# Patient Record
Sex: Female | Born: 2000 | Race: White | Hispanic: No | Marital: Single | Smoking: Current some day smoker
Health system: Southern US, Community
[De-identification: ages and names within clinical notes are randomized; demographics above are authoritative.]

## PROBLEM LIST (undated history)

## (undated) DIAGNOSIS — E063 Autoimmune thyroiditis: Secondary | ICD-10-CM

## (undated) DIAGNOSIS — R109 Unspecified abdominal pain: Secondary | ICD-10-CM

## (undated) DIAGNOSIS — F32A Depression, unspecified: Secondary | ICD-10-CM

## (undated) DIAGNOSIS — F419 Anxiety disorder, unspecified: Secondary | ICD-10-CM

## (undated) DIAGNOSIS — K589 Irritable bowel syndrome without diarrhea: Secondary | ICD-10-CM

## (undated) DIAGNOSIS — F329 Major depressive disorder, single episode, unspecified: Secondary | ICD-10-CM

## (undated) DIAGNOSIS — R197 Diarrhea, unspecified: Secondary | ICD-10-CM

## (undated) DIAGNOSIS — K900 Celiac disease: Secondary | ICD-10-CM

## (undated) DIAGNOSIS — F332 Major depressive disorder, recurrent severe without psychotic features: Secondary | ICD-10-CM

## (undated) HISTORY — PX: TONSILLECTOMY: SUR1361

## (undated) HISTORY — DX: Autoimmune thyroiditis: E06.3

## (undated) HISTORY — PX: ADENOIDECTOMY: SHX5191

## (undated) HISTORY — DX: Unspecified abdominal pain: R10.9

## (undated) HISTORY — DX: Diarrhea, unspecified: R19.7

## (undated) HISTORY — PX: TYMPANOSTOMY TUBE PLACEMENT: SHX32

---
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
## 2022-11-01 DIAGNOSIS — H6123 Impacted cerumen, bilateral: Secondary | ICD-10-CM | POA: Diagnosis not present

## 2024-01-30 ENCOUNTER — Ambulatory Visit: Payer: Self-pay | Admitting: General Practice

## 2024-07-14 ENCOUNTER — Ambulatory Visit: Payer: Self-pay | Admitting: Family Medicine

## 2024-07-14 ENCOUNTER — Encounter: Payer: Self-pay | Admitting: Family Medicine

## 2024-07-14 VITALS — BP 114/82 | HR 64 | Temp 98.2°F | Ht 66.0 in | Wt 294.5 lb

## 2024-07-14 DIAGNOSIS — N921 Excessive and frequent menstruation with irregular cycle: Secondary | ICD-10-CM | POA: Diagnosis not present

## 2024-07-14 DIAGNOSIS — K900 Celiac disease: Secondary | ICD-10-CM | POA: Diagnosis not present

## 2024-07-14 DIAGNOSIS — K2100 Gastro-esophageal reflux disease with esophagitis, without bleeding: Secondary | ICD-10-CM | POA: Diagnosis not present

## 2024-07-14 DIAGNOSIS — N920 Excessive and frequent menstruation with regular cycle: Secondary | ICD-10-CM | POA: Insufficient documentation

## 2024-07-14 DIAGNOSIS — F17200 Nicotine dependence, unspecified, uncomplicated: Secondary | ICD-10-CM

## 2024-07-14 DIAGNOSIS — E063 Autoimmune thyroiditis: Secondary | ICD-10-CM | POA: Diagnosis not present

## 2024-07-14 DIAGNOSIS — E039 Hypothyroidism, unspecified: Secondary | ICD-10-CM | POA: Insufficient documentation

## 2024-07-14 DIAGNOSIS — F3342 Major depressive disorder, recurrent, in full remission: Secondary | ICD-10-CM

## 2024-07-14 LAB — CBC WITH DIFFERENTIAL/PLATELET
Basophils Absolute: 0 K/uL (ref 0.0–0.1)
Basophils Relative: 0.6 % (ref 0.0–3.0)
Eosinophils Absolute: 0.2 K/uL (ref 0.0–0.7)
Eosinophils Relative: 3.8 % (ref 0.0–5.0)
HCT: 39.4 % (ref 36.0–46.0)
Hemoglobin: 13.2 g/dL (ref 12.0–15.0)
Lymphocytes Relative: 28.7 % (ref 12.0–46.0)
Lymphs Abs: 1.5 K/uL (ref 0.7–4.0)
MCHC: 33.6 g/dL (ref 30.0–36.0)
MCV: 86.9 fl (ref 78.0–100.0)
Monocytes Absolute: 0.4 K/uL (ref 0.1–1.0)
Monocytes Relative: 7.3 % (ref 3.0–12.0)
Neutro Abs: 3.2 K/uL (ref 1.4–7.7)
Neutrophils Relative %: 59.6 % (ref 43.0–77.0)
Platelets: 323 K/uL (ref 150.0–400.0)
RBC: 4.53 Mil/uL (ref 3.87–5.11)
RDW: 14.5 % (ref 11.5–15.5)
WBC: 5.4 K/uL (ref 4.0–10.5)

## 2024-07-14 LAB — COMPREHENSIVE METABOLIC PANEL WITH GFR
ALT: 20 U/L (ref 0–35)
AST: 12 U/L (ref 0–37)
Albumin: 4.3 g/dL (ref 3.5–5.2)
Alkaline Phosphatase: 58 U/L (ref 39–117)
BUN: 14 mg/dL (ref 6–23)
CO2: 26 meq/L (ref 19–32)
Calcium: 9.1 mg/dL (ref 8.4–10.5)
Chloride: 108 meq/L (ref 96–112)
Creatinine, Ser: 0.56 mg/dL (ref 0.40–1.20)
GFR: 130.84 mL/min (ref >60.00)
Glucose, Bld: 96 mg/dL (ref 70–99)
Potassium: 4.2 meq/L (ref 3.5–5.1)
Sodium: 140 meq/L (ref 135–145)
Total Bilirubin: 0.4 mg/dL (ref 0.2–1.2)
Total Protein: 7 g/dL (ref 6.0–8.3)

## 2024-07-14 LAB — LIPID PANEL
Cholesterol: 159 mg/dL (ref 0–200)
HDL: 43.4 mg/dL (ref >39.00)
LDL Cholesterol: 101 mg/dL — ABNORMAL HIGH (ref 0–99)
NonHDL: 115.75
Total CHOL/HDL Ratio: 4
Triglycerides: 76 mg/dL (ref 0.0–149.0)
VLDL: 15.2 mg/dL (ref 0.0–40.0)

## 2024-07-14 LAB — T4, FREE: Free T4: 0.73 ng/dL (ref 0.60–1.60)

## 2024-07-14 LAB — TSH: TSH: 1.6 u[IU]/mL (ref 0.35–5.50)

## 2024-07-14 NOTE — Assessment & Plan Note (Signed)
 Saw peds endo in past  Last took levothyroxine years ago- 75 mcg daily  Does not feel different off of it  Per pt no goiter or neck symptoms  Reassuring exam   Lab today

## 2024-07-14 NOTE — Patient Instructions (Signed)
 Labs today   Take care of yourself    Let us  know where you want to go to obgyn

## 2024-07-14 NOTE — Assessment & Plan Note (Signed)
 From Hashimoto's thyroiditis   No care in years  Past levothyroxine  Lab today including cholesterol

## 2024-07-14 NOTE — Assessment & Plan Note (Signed)
 Does not have gyn provider and desires referral Heavy menses-pad and super tampons- changes within the hour  Some irregular menses   Pt will ask where her sister goes and call us  with clinic she is interested in   Lab today

## 2024-07-14 NOTE — Progress Notes (Signed)
 Subjective:    Patient ID: Barbara Farrell, female    DOB: [birth year], 23 y.o.   MRN: [id number]  HPI  Wt Readings from Last 3 Encounters:  07/14/24 294 lb 8 oz (133.6 kg)  05/14/17 228 lb 2.8 oz (103.5 kg) (>99%, Z= 2.71)*  01/01/17 230 lb 6.1 oz (104.5 kg) (>99%, Z= 2.82)*   * Growth percentiles are based on CDC (Girls, 2-20 Years) data.   47.53 kg/m  Vitals:   07/14/24 0941  BP: 114/82  Pulse: 64  Temp: 98.2 F (36.8 C)  SpO2: 98%    Pt presents to get established for primary care   Pmhx notable for  Hashimoto's thyroiditis Celiac dz  GERD Depression   Declines flu shot    Thyroid   Used to take levothyroxine  Last dose 75 mcg daily - does not remember how she felt with this (in the midst of celiac)  Not feeling any different   Mood  Used to take fluoxetine  40 mg daily  Stopped getting care when her mother had strokes/ lost to care then Does not think she needs medicine   Did have hospitalization for mood - 19 or 23 yo  Some self harm there  Does not feel that way anymore   Still has bad anxiety but doing well overall      07/14/2024   10:43 AM  Depression screen PHQ 2/9  Decreased Interest 0  Down, Depressed, Hopeless 0  PHQ - 2 Score 0  Altered sleeping 0  Tired, decreased energy 0  Change in appetite 1  Feeling bad or failure about yourself  0  Trouble concentrating 0  Moving slowly or fidgety/restless 0  Suicidal thoughts 0  PHQ-9 Score 1  Difficult doing work/chores Not difficult at all       07/14/2024   10:44 AM  GAD 7 : Generalized Anxiety Score  Nervous, Anxious, on Edge 1  Control/stop worrying 0  Worry too much - different things 1  Trouble relaxing 0  Restless 0  Easily annoyed or irritable 1  Afraid - awful might happen 0  Total GAD 7 Score 3  Anxiety Difficulty Not difficult at all    Used to go to [facility]    GI  GERD- took protonix 40 mg daily in past Also carafate   She stopped drinking soda and avoids  triggers   Celiac- severe  Gets constipation and feels sick if she eats wheat  Vomiting  Is on gluten free diet  Doing a lot better  Symptom free if on the diet    Colonoscopy 2019 -none since   HIDA and us  2019   Needs to get set up with obgyn No contraception or need for it  One partner  Declines need for std screening  Not trying to get pregnant  Very heavy periods -does not take anything  Sometimes irregular -or more than one period per month   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 [employer]         Patient Active Problem List   Diagnosis Date Noted   Heavy menses 07/14/2024   Hypothyroid 07/14/2024   Vaping nicotine dependence, 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   Hashimoto's thyroiditis 10/23/2017   MDD (major depressive disorder) 05/14/2017   Gastroesophageal reflux disease 12/13/2015   Celiac disease 12/13/2015   Past Medical History:  Diagnosis Date   Abdominal pain, recurrent    Anxiety    Celiac disease    Depression    Diarrhea  Hashimoto's disease    IBS (irritable bowel syndrome)    MDD (major depressive disorder), recurrent severe, 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   Past Surgical History:  Procedure Laterality Date   ADENOIDECTOMY     TONSILLECTOMY     TYMPANOSTOMY TUBE PLACEMENT     Social History   Tobacco Use   Smoking status: Some Days    Types: Cigarettes   Smokeless tobacco: Never  Vaping Use   Vaping status: Every Day  Substance Use Topics   Alcohol use: Yes    Comment: Socially   Drug use: Yes    Types: Marijuana    Comment: Vape pen with possible Marijuana   Family History  Problem Relation Age of Onset   Stroke Mother    Hashimoto's thyroiditis Mother    Graves' disease Mother    Heart disease Mother    Atrial fibrillation Father    Stroke Maternal Grandmother    Miscarriages / Stillbirths Maternal Grandmother    Hypertension Maternal Grandmother    Hyperlipidemia Maternal Grandmother    Heart disease  Maternal Grandmother    Diabetes Maternal Grandmother    Cancer Maternal Grandmother    Kidney disease Maternal Grandfather    Miscarriages / Stillbirths Paternal Grandmother    COPD Paternal Grandmother    Arthritis Paternal Grandfather    Allergies  Allergen Reactions   Gluten Meal     No wheat, rye, barley, brewers yeast, malt.  Strict adherance is imperative per parents.  No cross contamination.   Other     Celiac Disease    No current outpatient medications on file prior to visit.   No current facility-administered medications on file prior to visit.    Review of Systems  Constitutional:  Negative for activity change, appetite change, fatigue, fever and unexpected weight change.  HENT:  Negative for congestion, ear pain, rhinorrhea, sinus pressure and sore throat.   Eyes:  Negative for pain, redness and visual disturbance.  Respiratory:  Negative for cough, shortness of breath and wheezing.   Cardiovascular:  Negative for chest pain and palpitations.  Gastrointestinal:  Negative for abdominal pain, blood in stool, constipation and diarrhea.  Endocrine: Negative for polydipsia and polyuria.  Genitourinary:  Positive for menstrual problem. Negative for dysuria, frequency and urgency.  Musculoskeletal:  Negative for arthralgias, back pain and myalgias.  Skin:  Negative for pallor and rash.  Allergic/Immunologic: Negative for environmental allergies.  Neurological:  Negative for dizziness, syncope and headaches.  Hematological:  Negative for adenopathy. Does not bruise/bleed easily.  Psychiatric/Behavioral:  Negative for decreased concentration and dysphoric mood. The patient is nervous/anxious.        Objective:   Physical Exam Constitutional:      General: She is not in acute distress.    Appearance: Normal appearance. She is well-developed. She is obese. She is not ill-appearing or diaphoretic.  HENT:     Head: Normocephalic and atraumatic.  Eyes:      Conjunctiva/sclera: Conjunctivae normal.     Pupils: Pupils are equal, round, and reactive to light.  Neck:     Thyroid : No thyromegaly.     Vascular: No carotid bruit or JVD.     Comments: Thyroid  is symmetric by palpation  Non tender  No thyroid  bruits  Cardiovascular:     Rate and Rhythm: Normal rate and regular rhythm.     Heart sounds: Normal heart sounds.     No gallop.  Pulmonary:     Effort: Pulmonary effort is normal. No  respiratory distress.     Breath sounds: Normal breath sounds. No wheezing or rales.  Abdominal:     General: There is no distension or abdominal bruit.     Palpations: Abdomen is soft. There is no mass.     Tenderness: There is no abdominal tenderness. There is no guarding or rebound.  Musculoskeletal:     Cervical back: Normal range of motion and neck supple.     Right lower leg: No edema.     Left lower leg: No edema.  Lymphadenopathy:     Cervical: No cervical adenopathy.  Skin:    General: Skin is warm and dry.     Coloration: Skin is not pale.     Findings: No rash.     Comments: Solar lentigines diffusely   Neurological:     Mental Status: She is alert.     Coordination: Coordination normal.     Deep Tendon Reflexes: Reflexes are normal and symmetric. Reflexes normal.     Comments: No tremor   Psychiatric:        Mood and Affect: Mood normal.           Assessment & Plan:   Problem List Items Addressed This Visit       Digestive   Gastroesophageal reflux disease   GERD with duodenitis (in setting of celiac) in past Took protonix and carafate  in past Nothing now  If she avoids triggers and gluten-no complaints Reassuring exam      Celiac disease   Last colonoscopy 2019 Gluten free diet  No problems as long as she sticks to this      Relevant Orders   CBC with Differential/Platelet     Endocrine   Hypothyroid   From Hashimoto's thyroiditis   No care in years  Past levothyroxine  Lab today including cholesterol       Relevant Orders   TSH   Lipid panel   T4, free   Hashimoto's thyroiditis - Primary   Saw peds endo in past  Last took levothyroxine years ago- 75 mcg daily  Does not feel different off of it  Per pt no goiter or neck symptoms  Reassuring exam   Lab today      Relevant Orders   TSH   T4, free     Other   Vaping nicotine dependence, non-tobacco product   Vapes nicotine once in a while  Plans on quitting soon with her partner Does not think she will need help   Reassuring exam Discussed reasons to uqit       MDD (major depressive disorder)   Had severe depression at age [age] with self harm  Was hospitalized once  Previously under care of psychiatry    Doing well now overall Some anxiety but does not desire treatment        Heavy menses   Does not have gyn provider and desires referral Heavy menses-pad and super tampons- changes within the hour  Some irregular menses   Pt will ask where her sister goes and call us  with clinic she is interested in   Lab today      Relevant Orders   Comprehensive metabolic panel with GFR   CBC with Differential/Platelet

## 2024-07-14 NOTE — Assessment & Plan Note (Signed)
 Had severe depression at age 21-16 with self harm  Was hospitalized once  Previously under care of psychiatry    Doing well now overall Some anxiety but does not desire treatment

## 2024-07-14 NOTE — Assessment & Plan Note (Signed)
 GERD with duodenitis (in setting of celiac) in past Took protonix and carafate  in past Nothing now  If she avoids triggers and gluten-no complaints Reassuring exam

## 2024-07-14 NOTE — Assessment & Plan Note (Signed)
 Last colonoscopy 2019 Gluten free diet  No problems as long as she sticks to this

## 2024-07-14 NOTE — Assessment & Plan Note (Signed)
 Vapes nicotine once in a while  Plans on quitting soon with her partner Does not think she will need help   Reassuring exam Discussed reasons to uqit

## 2024-09-22 ENCOUNTER — Encounter

## 2024-09-24 ENCOUNTER — Ambulatory Visit

## 2024-09-24 ENCOUNTER — Other Ambulatory Visit: Payer: Self-pay

## 2024-09-24 VITALS — BP 134/82 | HR 79 | Wt 304.5 lb

## 2024-09-24 DIAGNOSIS — Z3480 Encounter for supervision of other normal pregnancy, unspecified trimester: Secondary | ICD-10-CM | POA: Insufficient documentation

## 2024-09-24 DIAGNOSIS — O3680X0 Pregnancy with inconclusive fetal viability, not applicable or unspecified: Secondary | ICD-10-CM

## 2024-09-24 MED ORDER — PRENATE PIXIE 10-0.6-0.4-200 MG PO CAPS: 1.0000 | ORAL_CAPSULE | Freq: Every day | ORAL | 11 refills | Status: AC

## 2024-09-24 NOTE — Progress Notes (Incomplete)
 New OB Intake  I connected with Leita Lemmings  on 09/24/24 at 11:15 AM EST by {Contact:24193} Video Visit and verified that I am speaking with the correct person using two identifiers. Nurse is located at [facility] and pt is located at ***.  I discussed the limitations, risks, security and privacy concerns of performing an evaluation and management service by telephone and the availability of in person appointments. I also discussed with the patient that there may be a patient responsible charge related to this service. The patient expressed understanding and agreed to proceed.  I explained I am completing New OB Intake today. We discussed EDD of *** based on {EDD:33166}. Pt is G1P0000. I reviewed her allergies, medications and Medical/Surgical/OB history.    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 Discussed the nature of our practice with multiple providers including residents and students as well as female and female providers. Due to the size of the practice, the delivering provider may not be the same as those providing prenatal care.   Patient is not interested in water birth.  MyChart/Babyscripts MyChart access verified. I explained pt will have some visits in office and some virtually. Babyscripts instructions given and order placed. Patient verifies receipt of registration text/e-mail. Account successfully created and app downloaded. If patient is a candidate for Optimized scheduling, add to sticky note.   Blood Pressure Cuff/Weight Scale {blood pressure cuff:24241}  Explained after first prenatal appt pt will check weekly and document in Babyscripts. Patient {weight scale:28336}.  Anatomy US  Explained first scheduled US  will be around 19 weeks. Anatomy US  scheduled for TBD at TBD.  Is patient a CenteringPregnancy candidate?  {Accepted:19197::Accepted,Not a Candidate,Declined} Declined due to {Declined:19197::Schedule,Childcare,Group setting,Support person concern,Declined to say,Enrolled in [facility],***} Not a candidate due to {Not a Candidate:19197::DM,CHTN, medication controlled,Language barrier,>28 weeks,Multiple gestation (mono-mono or mono-di),Complex coordination of care needed,***} If accepted,    Is patient a Mom+Baby Combined Care candidate?  {Accepted:19197::Accepted,Declined,Not a candidate,***}   If accepted, confirm patient does not intend to move from the area for at least 12 months, then notify Mom+Baby staff  Is patient a candidate for Babyscripts Optimization? {babyscripts:31704}   First visit review I reviewed new OB appt with patient. Explained pt will be seen by Dr. Alger at first visit. Discussed [place] genetic screening with patient. *** Panorama and Horizon.. Routine prenatal labs {collected today/needed at new OB visit:9024}   Last Pap No results found for: DIAGPAP  Rocky CHRISTELLA Ober, RN 09/24/2024  11:57 AM

## 2024-09-25 ENCOUNTER — Telehealth: Payer: Self-pay | Admitting: Licensed Clinical Social Worker

## 2024-09-25 ENCOUNTER — Ambulatory Visit: Payer: Self-pay | Admitting: Licensed Clinical Social Worker

## 2024-09-25 DIAGNOSIS — O99341 Other mental disorders complicating pregnancy, first trimester: Secondary | ICD-10-CM

## 2024-09-25 NOTE — BH Specialist Note (Unsigned)
 "   Integrated Behavioral Health via Telemedicine Visit  09/25/2024 Barbara Farrell [id number]  Number of Integrated Behavioral Health Clinician visits: No data recorded Session Start time: No data recorded  Session End time: No data recorded Total time in minutes: No data recorded   Referring Provider: *** Patient/Family location: Home [facility] Provider location: Remote Office All persons participating in visit: Patient and [facility] Types of Service: Individual psychotherapy and Video visit  I connected with Barbara Farrell and/or Barbara Warren's patient via  Telephone or Video Enabled Telemedicine Application  (Video is Caregility application) and verified that I am speaking with the correct person using two identifiers. Discussed confidentiality: Yes   I discussed the limitations of telemedicine and the availability of in person appointments.  Discussed there is a possibility of technology failure and discussed alternative modes of communication if that failure occurs.  I discussed that engaging in this telemedicine visit, they consent to the provision of behavioral healthcare and the services will be billed under their insurance.  Patient and/or legal guardian expressed understanding and consented to Telemedicine visit: Yes   Presenting Concerns: Patient and/or family reports the following symptoms/concerns: Increased anxiety symptoms.  Duration of problem: Months; Severity of problem: moderate  Patient and/or Family's Strengths/Protective Factors: Social and Emotional competence, Concrete supports in place (healthy food, safe environments, etc.), and Physical Health (exercise, healthy diet, medication compliance, etc.)  Goals Addressed: Patient will:  Reduce symptoms of: anxiety and depression   Increase knowledge and/or ability of: coping skills and healthy habits   Demonstrate ability to: Increase healthy adjustment to current life circumstances and Increase adequate support systems for  patient/family  Progress towards Goals: Ongoing    Interventions: Interventions utilized:  Mindfulness or Relaxation Training, Supportive Counseling, Psychoeducation and/or Health Education, and Supportive Reflection Standardized Assessments completed: Edinburgh Postnatal Depression   Edinburgh Postnatal Depression Scale - 09/25/24 1427       Edinburgh Postnatal Depression Scale:  In the Past 7 Days   I have been able to laugh and see the funny side of things. 1    I have looked forward with enjoyment to things. 1    I have blamed myself unnecessarily when things went wrong. 2    I have been anxious or worried for no good reason. 2    I have felt scared or panicky for no good reason. 2    Things have been getting on top of me. 2    I have been so unhappy that I have had difficulty sleeping. 0    I have felt sad or miserable. 1    I have been so unhappy that I have been crying. 1    The thought of harming myself has occurred to me. 0    Edinburgh Postnatal Depression Scale Total 12           Patient and/or Family Response: Patient was present for todays virtual session. She reports a history of anxiety that was previously manageable with coping strategies but states her symptoms have significantly increased since becoming pregnant. 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 She reports episodes of crying, hyperventilation, and panic during overnights, often calling her partner for reassurance and support. Patient explored possible abandonment-related fears, including concerns about being alone and fears related to her partner leaving while she is away. Psychoeducation was provided regarding perinatal anxiety and depression, and the Edinburgh screening tool was reviewed. Coping strategies  for managing anxiety and depressive symptoms during pregnancy were  discussed and practiced, including positive affirmations, grounding techniques, present-moment focus, and intentional mindfulness.  Clinical Assessment/Diagnosis  Perinatal depression in first trimester    Assessment: Patient currently experiencing increased perinatal anxiety characterized by panic symptoms, avoidance of overnight separation, and heightened reassurance-seeking behaviors. Symptoms appear to have intensified since pregnancy and are impacting occupational functioning.   Patient may benefit from continued support of integrated behavioral health services.  Plan: Follow up with behavioral health clinician on : *** Behavioral recommendations: *** Referral(s): Integrated [employer] (In Clinic)  I discussed the assessment and treatment plan with the patient and/or parent/guardian. They were provided an opportunity to ask questions and all were answered. They agreed with the plan and demonstrated an understanding of the instructions.   They were advised to call back or seek an in-person evaluation if the symptoms worsen or if the condition fails to improve as anticipated.  Joanna Hall LITTIE Seats, LCSWA "

## 2024-09-25 NOTE — BH Specialist Note (Signed)
 Behavioral Health Treatment Plan   Name:Barbara Farrell    MRN: [id number]   Treatment Plan Development Date: 3:15 PM    Strengths: Supportive Relationships, Family, Friends, and Able to Communicate Effectively  Supports: Significant Other and Friends   Theatre Manager of Needs: I would like to explore ways to keep myself calm and to be in better control of my emotions.    Treatment Level:Would like to do every two weeks.   Client Treatment Preferences:Virtual is fine.    Diagnosis Depressive Disorders   Symptoms:  There has never been a manic episode or hypomanic episode.  Goals:  Recognize, accept, and cope with depressed feelings., Develop healthy thinking and beliefs about self, others, and the world to alleviate and prevent relapse., and Establish healthy relationships that alleviate and prevent relapse.  Objectives: Target Date For All Objectives: 3 months  Describe experiences with depression including impact on functioning and attempts to resolve., Identify and replace thoughts and beliefs that support depression., Learn and implement problem-solving., Learn and implement relapse prevention skills., and Verbalize an understanding of healthy and unhealthy emotions and increase the use of healthy emotions.  Progress Documentation:  ongoing  Interventions:  Psycho-education/Bibliotherapy   Expected duration of treatment: 3 months  Party responsible for implementation of interventions: Patient and [facility].   This plan has been reviewed and created by the following participants: Patient and [facility]   A new plan will be created at least every 12 months.  The patient fully participated in the development of treatment plan with the clinician and verbally consents to such treatment.   Due to visit being conducted virtually, verbal consent for the treatment plan was obtained following review of the treatment plan, its intended use, and limitations.The patient  and/or guardian demonstrated understanding and were agreeable to the plan.     Kelcie Currie LITTIE Seats, LCSWA

## 2024-10-02 ENCOUNTER — Encounter: Payer: Self-pay | Admitting: Licensed Clinical Social Worker

## 2024-10-13 ENCOUNTER — Encounter: Payer: Self-pay | Admitting: Obstetrics and Gynecology
